# Patient Record
Sex: Male | Born: 1995 | Race: Black or African American | Hispanic: No | Marital: Married | Smoking: Never smoker
Health system: Southern US, Community
[De-identification: ages and names within clinical notes are randomized; demographics above are authoritative.]

## PROBLEM LIST (undated history)

## (undated) DIAGNOSIS — M6740 Ganglion, unspecified site: Secondary | ICD-10-CM

---
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
## 2016-04-23 ENCOUNTER — Emergency Department (HOSPITAL_COMMUNITY)
Admission: EM | Admit: 2016-04-23 | Discharge: 2016-04-23 | Disposition: A | Discharge status: Home / Self Care | Payer: 59 | Attending: Emergency Medicine | Admitting: Emergency Medicine

## 2016-04-23 ENCOUNTER — Encounter (HOSPITAL_COMMUNITY): Payer: Self-pay

## 2016-04-23 DIAGNOSIS — Z791 Long term (current) use of non-steroidal anti-inflammatories (NSAID): Secondary | ICD-10-CM | POA: Diagnosis not present

## 2016-04-23 DIAGNOSIS — R202 Paresthesia of skin: Secondary | ICD-10-CM | POA: Diagnosis present

## 2016-04-23 MED ORDER — PREDNISONE 20 MG PO TABS: 40.0000 mg | ORAL_TABLET | Freq: Every day | ORAL | 0 refills | Status: DC

## 2016-04-23 NOTE — Discharge Instructions (Signed)
Prednisone as prescribed until all gone. Follow up with a family doctor for further evaluation. Return if worsening symptoms or new concerning symptoms.

## 2016-04-23 NOTE — ED Notes (Signed)
PA at bedside.

## 2016-04-23 NOTE — ED Notes (Signed)
Bed: [facility] Expected date:  Expected time:  Means of arrival:  Comments: Pt in 22 

## 2016-04-23 NOTE — ED Provider Notes (Signed)
[facility]-EMERGENCY DEPT Provider Note   CSN: [id number] Arrival date & time: 04/23/16  1016     History   Chief Complaint Chief Complaint  Patient presents with  . Tingling    HPI Kevin Abbott is a 20 y.o. male.  HPI Kevin Abbott is a 20 y.o. male who presents to emergency department complaining of bilateral leg tingling. Patient states he has had similar symptoms in the past, states sometimes gets tingling his feet after he sits on the toilet for 2 long. States 2 days ago he woke up from his sleep with tingling in his feet. States since then symptoms on and off. He denies any weakness or numbness to his legs or feet. He states tingling in his all the way from his hips down to his toes. Denies any back pain or pain in his extremities. Denies any injuries or falls. No fever or chills. Denies any medical problems. No drugs or alcohol. No over-the-counter products.  Past Medical History:  Diagnosis Date  . Ganglion cyst     There are no active problems to display for this patient.   Past Surgical History:  Procedure Laterality Date  . ganglion cyst removal  2007       Home Medications    Prior to Admission medications   Medication Sig Start Date End Date Taking? Authorizing Provider  acetaminophen (TYLENOL) 500 MG tablet Take 500 mg by mouth every 6 (six) hours as needed. For headache    Historical Provider, MD  cyclobenzaprine (FLEXERIL) 10 MG tablet Take 1 tablet (10 mg total) by mouth 2 (two) times daily as needed for muscle spasms. 05/20/14   Roxy Horsemanobert Browning, PA-C  ibuprofen (ADVIL,MOTRIN) 600 MG tablet Take 1 tab PO Q6h x 2 days then Q6h prn 07/19/12   Lowanda FosterMindy Brewer, NP  ibuprofen (ADVIL,MOTRIN) 800 MG tablet Take 1 tablet (800 mg total) by mouth 3 (three) times daily. 05/20/14   Roxy Horsemanobert Browning, PA-C    Family History No family history on file.  Social History Social History  Substance Use Topics  . Smoking status: Never Smoker  . Smokeless tobacco: Never Used  .  Alcohol use No     Allergies   Amoxicillin and Latex   Review of Systems Review of Systems  Constitutional: Negative for chills and fever.  Respiratory: Negative for cough, chest tightness and shortness of breath.   Cardiovascular: Negative for chest pain, palpitations and leg swelling.  Gastrointestinal: Negative for abdominal distention, abdominal pain, diarrhea, nausea and vomiting.  Musculoskeletal: Negative for arthralgias, myalgias, neck pain and neck stiffness.  Skin: Negative for rash.  Allergic/Immunologic: Negative for immunocompromised state.  Neurological: Positive for numbness. Negative for dizziness, weakness, light-headedness and headaches.  All other systems reviewed and are negative.    Physical Exam Updated Vital Signs BP 117/74 (BP Location: Left Arm)   Pulse 72   Temp 98.7 F (37.1 C) (Oral)   Resp 14   Ht 6' (1.829 m)   SpO2 99%   Physical Exam  Constitutional: He appears well-developed and well-nourished. No distress.  HENT:  Head: Normocephalic and atraumatic.  Eyes: Conjunctivae are normal.  Neck: Neck supple.  Cardiovascular: Normal rate, regular rhythm and normal heart sounds.   Pulmonary/Chest: Effort normal. No respiratory distress. He has no wheezes. He has no rales.  Musculoskeletal: He exhibits no edema.  Full range of motion of bilateral legs at hip, knee, ankle joints. Dorsal pedal pulses intact. Legs are warm, cap refill less than 2 seconds  distally bilaterally.  Neurological: He is alert.  5 out of 5 and equal strength in bilateral lower extremities. Patellar reflexes and ankle reflexes are 2+ bilaterally. Sensation intact in upper and lower legs, in all dermatomes.  Skin: Skin is warm and dry. Capillary refill takes less than 2 seconds.  Nursing note and vitals reviewed.    ED Treatments / Results  Labs (all labs ordered are listed, but only abnormal results are displayed) Labs Reviewed - No data to display  EKG  EKG  Interpretation None       Radiology No results found.  Procedures Procedures (including critical care time)  Medications Ordered in ED Medications - No data to display   Initial Impression / Assessment and Plan / ED Course  I have reviewed the triage vital signs and the nursing notes.  Pertinent labs & imaging results that were available during my care of the patient were reviewed by me and considered in my medical decision making (see chart for details).  Clinical Course    Patient emergency department with tingling to bilateral legs, worse after sleeping and sitting for prolonged period of time. Suspect possible nerve inflammation. He has no weakness or decreased sensation on exam, reflexes are intact. The stomach do not think he needs any emergent imaging. I did discuss with him that if his symptoms progress or worsen he will need to follow-up for further evaluation. Considered nerve impingement, radicular symptoms, Guillain-Barr. Patient understands the plan and will follow-up or return if worsening. Will start on prednisone taper.  Vitals:   04/23/16 1021 04/23/16 1241  BP: 117/74 126/79  Pulse: 72 78  Resp: 14 16  Temp: 98.7 F (37.1 C)   TempSrc: Oral   SpO2: 99% 100%  Height: 6' (1.829 m)     Final Clinical Impressions(s) / ED Diagnoses   Final diagnoses:  Paresthesia    New Prescriptions Discharge Medication List as of 04/23/2016 12:05 PM    START taking these medications   Details  predniSONE (DELTASONE) 20 MG tablet Take 2 tablets (40 mg total) by mouth daily., Starting Tue 04/23/2016, Print         Jaynie Crumbleatyana Deara Bober, PA-C 04/23/16 2009    Alvira MondayErin Schlossman, MD 04/25/16 0201

## 2016-04-23 NOTE — ED Triage Notes (Signed)
Pt presents with c/o tingling in both legs that started when he woke up yesterday. Pt denies any injury, denies numbness to the area, reports only a tingling feeling. Pt ambulatory to triage.

## 2016-04-23 NOTE — ED Notes (Signed)
Discharge instructions, follow up care, and rx x1 reviewed with patient. Patient verbalized understanding. 

## 2016-05-06 ENCOUNTER — Emergency Department (HOSPITAL_COMMUNITY)
Admission: EM | Admit: 2016-05-06 | Discharge: 2016-05-07 | Disposition: A | Discharge status: Home / Self Care | Payer: 59 | Attending: Emergency Medicine | Admitting: Emergency Medicine

## 2016-05-06 ENCOUNTER — Encounter (HOSPITAL_COMMUNITY): Payer: Self-pay | Admitting: Emergency Medicine

## 2016-05-06 DIAGNOSIS — R45851 Suicidal ideations: Secondary | ICD-10-CM | POA: Insufficient documentation

## 2016-05-06 DIAGNOSIS — Z5181 Encounter for therapeutic drug level monitoring: Secondary | ICD-10-CM | POA: Insufficient documentation

## 2016-05-06 DIAGNOSIS — F4321 Adjustment disorder with depressed mood: Secondary | ICD-10-CM | POA: Diagnosis not present

## 2016-05-06 LAB — CBC
HEMATOCRIT: 43.1 % (ref 39.0–52.0)
HEMOGLOBIN: 14.6 g/dL (ref 13.0–17.0)
MCH: 28.3 pg (ref 26.0–34.0)
MCHC: 33.9 g/dL (ref 30.0–36.0)
MCV: 83.7 fL (ref 78.0–100.0)
Platelets: 191 10*3/uL (ref 150–400)
RBC: 5.15 MIL/uL (ref 4.22–5.81)
RDW: 13.4 % (ref 11.5–15.5)
WBC: 5.9 10*3/uL (ref 4.0–10.5)

## 2016-05-06 LAB — SALICYLATE LEVEL

## 2016-05-06 LAB — COMPREHENSIVE METABOLIC PANEL
ALK PHOS: 67 U/L (ref 38–126)
ALT: 15 U/L — AB (ref 17–63)
AST: 21 U/L (ref 15–41)
Albumin: 5 g/dL (ref 3.5–5.0)
Anion gap: 5 (ref 5–15)
BILIRUBIN TOTAL: 0.9 mg/dL (ref 0.3–1.2)
BUN: 15 mg/dL (ref 6–20)
CHLORIDE: 108 mmol/L (ref 101–111)
CO2: 27 mmol/L (ref 22–32)
CREATININE: 1.04 mg/dL (ref 0.61–1.24)
Calcium: 9.8 mg/dL (ref 8.9–10.3)
GFR calc Af Amer: >60 mL/min (ref >60)
GFR calc non Af Amer: >60 mL/min (ref >60)
GLUCOSE: 98 mg/dL (ref 65–99)
Potassium: 3.9 mmol/L (ref 3.5–5.1)
SODIUM: 140 mmol/L (ref 135–145)
Total Protein: 8.2 g/dL — ABNORMAL HIGH (ref 6.5–8.1)

## 2016-05-06 LAB — ETHANOL: Alcohol, Ethyl (B): <5 mg/dL (ref <5)

## 2016-05-06 LAB — RAPID URINE DRUG SCREEN, HOSP PERFORMED
AMPHETAMINES: NOT DETECTED
Barbiturates: NOT DETECTED
Benzodiazepines: NOT DETECTED
Cocaine: NOT DETECTED
OPIATES: NOT DETECTED
TETRAHYDROCANNABINOL: POSITIVE — AB

## 2016-05-06 LAB — ACETAMINOPHEN LEVEL: Acetaminophen (Tylenol), Serum: <10 ug/mL — ABNORMAL LOW (ref 10–30)

## 2016-05-06 MED ORDER — IBUPROFEN 200 MG PO TABS: 600.0000 mg | ORAL_TABLET | Freq: Three times a day (TID) | ORAL | Status: DC | PRN

## 2016-05-06 MED ORDER — ACETAMINOPHEN 325 MG PO TABS: 650.0000 mg | ORAL_TABLET | ORAL | Status: DC | PRN

## 2016-05-06 MED ORDER — LORAZEPAM 1 MG PO TABS: 1.0000 mg | ORAL_TABLET | Freq: Three times a day (TID) | ORAL | Status: DC | PRN

## 2016-05-06 MED ORDER — ONDANSETRON HCL 4 MG PO TABS: 4.0000 mg | ORAL_TABLET | Freq: Three times a day (TID) | ORAL | Status: DC | PRN

## 2016-05-06 NOTE — [facility] Assessment (Signed)
[facility] Assessment Progress Note   Case was staffed with Lord DNP who recommended patient be monitored and re-evaluated in the a.m.  
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
## 2016-05-06 NOTE — ED Triage Notes (Signed)
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 Pt has been unable to work because of hte pain medications he is on for his knee. Pt also sts his girlfriend made a comment to him at the mall earlier today that made him upset. Pt sts a family member and a friend died recently. Pt sts when he was driving earlier today he felt like half of him was wanting him to drive off the bridge. Pt sts he feels like he needs help so he came here. Pt tearful in triage.

## 2016-05-06 NOTE — ED Notes (Signed)
[id number] Expected date:  Expected time:  Means of arrival:  Comments: Hold for Triage 9

## 2016-05-06 NOTE — ED Notes (Signed)
Bed: WTR9 Expected date:  Expected time:  Means of arrival:  Comments: 
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
## 2016-05-06 NOTE — ED Notes (Signed)
Pt wanded a 2nd time by [employer].

## 2016-05-06 NOTE — ED Notes (Signed)
Bed: WTR8 Expected date:  Expected time:  Means of arrival:  Comments: 

## 2016-05-07 DIAGNOSIS — F4321 Adjustment disorder with depressed mood: Secondary | ICD-10-CM | POA: Diagnosis not present

## 2016-05-07 NOTE — BH Assessment (Signed)
[facility] Assessment Progress Note  Per Thedore MinsMojeed Akintayo, MD, this pt does not require psychiatric hospitalization at this time.  [facility] to follow up with an outpatient therapist.  This writer spoke to pt in person.  He does not want me to schedule an appointment for him, but is willing to accept referrals to follow up at his own initiative.  Discharge instructions include referral information for the [facility] [place], [facility], and [facility].  Pt's nurse, Diane, has been notified.  Doylene Canninghomas Odysseus Cada, MA Triage Specialist [phone]

## 2016-05-07 NOTE — ED Notes (Signed)
Pt expressed readiness to be discharged. Denies SI/HI. Pt discharged with resources for follow-up treatment. Pt verbalized understanding of discharge instructions. All belongings returned to pt who signed for same. Pt escorted to the exit and left and a friend or family member.

## 2016-05-07 NOTE — Discharge Instructions (Signed)
For your ongoing behavioral health needs, you are advised to follow up with an outpatient therapist.  Contact one of the following practices at your earliest opportunity to ask about scheduling an intake appointment:       [facility]      [address]      [place], [place] [address]      [phone]       [facility]      [facility]      [address]      [place] [address]      [phone]       [facility]      [address], [address]      [place], [place] [address]      [phone]
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
## 2016-05-08 NOTE — [facility] Suicide Risk Assessment (Signed)
Suicide Risk Assessment  Discharge Assessment   [facility]   Principal Problem: Adjustment disorder with depressed mood Discharge Diagnoses:  Patient Active Problem List   Diagnosis Date Noted  . Adjustment disorder with depressed mood [F43.21] 05/07/2016    Priority: High    Total Time spent with patient: 45 minutes   Musculoskeletal: Strength & Muscle Tone: within normal limits Gait & Station: normal Patient leans: N/A  Psychiatric Specialty Exam: Physical Exam  Constitutional: He is oriented to person, place, and time. He appears well-developed and well-nourished.  HENT:  Head: Normocephalic.  Neck: Normal range of motion.  Respiratory: Effort normal.  Musculoskeletal: Normal range of motion.  Neurological: He is alert and oriented to person, place, and time.  Skin: Skin is warm and dry.  Psychiatric: His speech is normal and behavior is normal. Judgment and thought content normal. Cognition and memory are normal. He exhibits a depressed mood.    Review of Systems  Constitutional: Negative.   HENT: Negative.   Eyes: Negative.   Respiratory: Negative.   Cardiovascular: Negative.   Gastrointestinal: Negative.   Genitourinary: Negative.   Musculoskeletal: Negative.   Skin: Negative.   Neurological: Negative.   Endo/Heme/Allergies: Negative.   Psychiatric/Behavioral: Positive for depression.    Blood pressure 112/92, pulse 105, temperature 98.3 F (36.8 C), temperature source Oral, resp. rate 18, SpO2 98 %.There is no height or weight on file to calculate BMI.  General Appearance: Casual  Eye Contact:  Good  Speech:  Normal Rate  Volume:  Normal  Mood:  Depressed, mild  Affect:  Non-Congruent  Thought Process:  Coherent and Descriptions of Associations: Intact  Orientation:  Full (Time, Place, and Person)  Thought Content:  WDL  Suicidal Thoughts:  No  Homicidal Thoughts:  No  Memory:  Immediate;   Good Recent;   Good Remote;    Good  Judgement:  Good  Insight:  Good  Psychomotor Activity:  Normal  Concentration:  Concentration: Good and Attention Span: Good  Recall:  Good  Fund of Knowledge:  Good  Language:  Fair  Akathisia:  No  Handed:  Right  AIMS (if indicated):     Assets:  Housing Intimacy Leisure Time Physical Health Resilience Social Support Vocational/Educational  ADL's:  Intact  Cognition:  WNL  Sleep:       Mental Status Per Nursing Assessment::   On Admission:   suicidal ideations  Demographic Factors:  Male and Adolescent or young adult  Loss Factors: NA  Historical Factors: NA  Risk Reduction Factors:   Responsible for children under 20 years of age, Sense of responsibility to family, Employed, Living with another person, especially a relative, Positive social support and Positive coping skills or problem solving skills  Continued Clinical Symptoms:  Depression, mild  Cognitive Features That Contribute To Risk:  None    Suicide Risk:  Minimal: No identifiable suicidal ideation.  Patients presenting with no risk factors but with morbid ruminations; may be classified as minimal risk based on the severity of the depressive symptoms    Plan Of Care/Follow-up recommendations:  Activity:  as tolerated Diet:  heart healthy diet  Lindbergh Winkles, NP 05/08/2016, 9:36 PM

## 2016-09-27 ENCOUNTER — Encounter (HOSPITAL_COMMUNITY): Payer: Self-pay | Admitting: Emergency Medicine

## 2016-09-27 ENCOUNTER — Emergency Department (HOSPITAL_COMMUNITY): Payer: Self-pay

## 2016-09-27 DIAGNOSIS — Z5321 Procedure and treatment not carried out due to patient leaving prior to being seen by health care provider: Secondary | ICD-10-CM | POA: Insufficient documentation

## 2016-09-27 DIAGNOSIS — S61211A Laceration without foreign body of left index finger without damage to nail, initial encounter: Secondary | ICD-10-CM | POA: Insufficient documentation

## 2016-09-27 DIAGNOSIS — Y92009 Unspecified place in unspecified non-institutional (private) residence as the place of occurrence of the external cause: Secondary | ICD-10-CM | POA: Insufficient documentation

## 2016-09-27 DIAGNOSIS — W268XXA Contact with other sharp object(s), not elsewhere classified, initial encounter: Secondary | ICD-10-CM | POA: Insufficient documentation

## 2016-09-27 DIAGNOSIS — Y999 Unspecified external cause status: Secondary | ICD-10-CM | POA: Insufficient documentation

## 2016-09-27 DIAGNOSIS — Y9389 Activity, other specified: Secondary | ICD-10-CM | POA: Insufficient documentation

## 2016-09-27 NOTE — ED Triage Notes (Signed)
Pt states he got cut while watching dishes at home with a glass of wine, on his index finger, around 2 inches long laceration present on his finger left hand, bleeding control, no changes on sensation able to move all fingers 3/10 pain a this time.

## 2016-09-28 ENCOUNTER — Emergency Department (HOSPITAL_COMMUNITY)
Admission: EM | Admit: 2016-09-28 | Discharge: 2016-09-28 | Disposition: A | Discharge status: Home / Self Care | Payer: Self-pay | Attending: Emergency Medicine | Admitting: Emergency Medicine

## 2016-09-28 NOTE — ED Notes (Signed)
Patient up to desk with mother.  Patient and mother arguing about patient staying.  This RN explained wait times, and explained that there might be rooms opening soon.   Bosie ClosJudith, EMT [facility] and expected wait time same.  Patient decided to leave.  Return precautions reviewed, and risks associated with leaving without assessment/sutures reviewed.  Patient aware, [facility] "[facility] wait time [facility] only thirty minutes"

## 2018-06-17 ENCOUNTER — Encounter (HOSPITAL_COMMUNITY): Payer: Self-pay | Admitting: Emergency Medicine

## 2018-06-17 ENCOUNTER — Ambulatory Visit (HOSPITAL_COMMUNITY)
Admission: EM | Admit: 2018-06-17 | Discharge: 2018-06-17 | Disposition: A | Discharge status: Home / Self Care | Payer: Self-pay | Attending: Nurse Practitioner | Admitting: Nurse Practitioner

## 2018-06-17 ENCOUNTER — Other Ambulatory Visit: Payer: Self-pay

## 2018-06-17 DIAGNOSIS — S239XXA Sprain of unspecified parts of thorax, initial encounter: Secondary | ICD-10-CM

## 2018-06-17 DIAGNOSIS — S161XXA Strain of muscle, fascia and tendon at neck level, initial encounter: Secondary | ICD-10-CM

## 2018-06-17 MED ORDER — CYCLOBENZAPRINE HCL 10 MG PO TABS: 10.0000 mg | ORAL_TABLET | Freq: Two times a day (BID) | ORAL | 0 refills | Status: AC | PRN

## 2018-06-17 MED ORDER — IBUPROFEN 800 MG PO TABS: 800.0000 mg | ORAL_TABLET | Freq: Three times a day (TID) | ORAL | 0 refills | Status: AC

## 2018-06-17 NOTE — Discharge Instructions (Signed)
Take medications as prescribed. Alternate between ice and heat to affected areas for at 15 minutes at least four times daily. No heavy lifting or strenuous activity for the next 5 days
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
## 2019-06-22 ENCOUNTER — Other Ambulatory Visit: Payer: Self-pay

## 2019-06-22 ENCOUNTER — Ambulatory Visit (HOSPITAL_COMMUNITY)
Admission: EM | Admit: 2019-06-22 | Discharge: 2019-06-22 | Disposition: A | Discharge status: Home / Self Care | Payer: Self-pay | Attending: Family Medicine | Admitting: Family Medicine

## 2019-06-22 ENCOUNTER — Encounter (HOSPITAL_COMMUNITY): Payer: Self-pay

## 2019-06-22 DIAGNOSIS — W5911XA Bitten by nonvenomous snake, initial encounter: Secondary | ICD-10-CM

## 2019-06-22 DIAGNOSIS — M25572 Pain in left ankle and joints of left foot: Secondary | ICD-10-CM

## 2019-06-22 NOTE — ED Provider Notes (Signed)
[name]    CSN: [id number] Arrival date & time: 06/22/19  1826      History   Chief Complaint Chief Complaint  Patient presents with  . Snake Bite    Foot    HPI Kevin Abbott is a 23 y.o. male.   He is presenting with a snake bite to the left ankle.  This occurred about an hour ago.  Denies any significant pain.  Does have some tenderness at the 2 puncture wounds over the left ankle.  Denies any streaking or trouble walking.  The snake did not appear to be venomous upon his pictures with his cell phone.  Has been bitten before when he was younger and this feels much different than that.  Denies any numbness or tingling.  HPI  Past Medical History:  Diagnosis Date  . Ganglion cyst     Patient Active Problem List   Diagnosis Date Noted  . Adjustment disorder with depressed mood 05/07/2016    Past Surgical History:  Procedure Laterality Date  . ganglion cyst removal  2007       Home Medications    Prior to Admission medications   Medication Sig Start Date End Date Taking? Authorizing Provider  cyclobenzaprine (FLEXERIL) 10 MG tablet Take 1 tablet (10 mg total) by mouth 2 (two) times daily as needed for muscle spasms. 06/17/18   Kevin Sack, FNP  ibuprofen (ADVIL,MOTRIN) 800 MG tablet Take 1 tablet (800 mg total) by mouth 3 (three) times daily. 06/17/18   Kevin Sack, FNP    Family History Family History  Problem Relation Age of Onset  . Lupus Mother   . Sickle cell anemia Mother   . Healthy Father     Social History Social History   Tobacco Use  . Smoking status: Never Smoker  . Smokeless tobacco: Never Used  Substance Use Topics  . Alcohol use: Yes    Comment: socially  . Drug use: No     Allergies   Amoxicillin, Prednisone, and Latex   Review of Systems Review of Systems  Constitutional: Negative for fever.  HENT: Negative for congestion.   Respiratory: Negative for cough.   Cardiovascular: Negative for chest pain.   Gastrointestinal: Negative for abdominal pain.  Musculoskeletal: Negative for gait problem.  Skin: Positive for wound.  Neurological: Negative for weakness.  Hematological: Negative for adenopathy.     Physical Exam Triage Vital Signs ED Triage Vitals  Enc Vitals Group     BP 06/22/19 1841 122/82     Pulse Rate 06/22/19 1841 87     Resp 06/22/19 1841 16     Temp 06/22/19 1841 98.6 F (37 C)     Temp Source 06/22/19 1841 Temporal     SpO2 06/22/19 1841 100 %     Weight --      Height --      Head Circumference --      Peak Flow --      Pain Score 06/22/19 1839 0     Pain Loc --      Pain Edu? --      Excl. in [place]? --    No data found.  Updated Vital Signs BP 122/82 (BP Location: Right Arm)   Pulse 87   Temp 98.6 F (37 C) (Temporal)   Resp 16   SpO2 100%   Visual Acuity Right Eye Distance:   Left Eye Distance:   Bilateral Distance:    Right Eye Near:  Left Eye Near:    Bilateral Near:     Physical Exam Gen: NAD, alert, cooperative with exam, well-appearing ENT: normal lips, normal nasal mucosa,  Eye: normal EOM, normal conjunctiva and lids CV:  no edema, +2 pedal pulses   Resp: no accessory muscle use, non-labored,   Skin: no rashes, no areas of induration  Neuro: normal tone, normal sensation to touch Psych:  normal insight, alert and oriented MSK:  Left ankle: 2 small puncture wounds just posteriorly to the lateral malleolus. Mild tenderness to palpation of these. No streaking or overlying redness. Neurovascular intact      UC Treatments / Results  Labs (all labs ordered are listed, but only abnormal results are displayed) Labs Reviewed - No data to display  EKG   Radiology No results found.  Procedures Procedures (including critical care time)  Medications Ordered in UC Medications - No data to display  Initial Impression / Assessment and Plan / UC Course  I have reviewed the triage vital signs and the nursing notes.   Pertinent labs & imaging results that were available during my care of the patient were reviewed by me and considered in my medical decision making (see chart for details).     Kevin Abbott is a 23 year old male that is presenting with snakebite and left ankle pain.  Appears to be nonvenomous based on the presentation.  He had this occur about an hour ago.  Counseled on wound management and care.  He reports having tetanus within the last 10 years.  Counseled to seek immediate care if there is any change to the site of puncture.  Counseled on supportive care.  Final Clinical Impressions(s) / UC Diagnoses   Final diagnoses:  Snake bite, initial encounter  Acute left ankle pain     Discharge Instructions     Please keep the area clean  Please monitor the area.  Please be seen in the emergency department if your symptoms change at all      ED Prescriptions    None     PDMP not reviewed this encounter.   Myra Rude, MD 06/22/19 [phone]

## 2019-06-22 NOTE — Discharge Instructions (Signed)
Please keep the area clean  Please monitor the area.  Please be seen in the emergency department if your symptoms change at all

## 2019-06-22 NOTE — ED Triage Notes (Signed)
Patient presents to Urgent Care with complaints of snake bite on his left foot near his ankle since earlier this afternoon. Patient reports he was cutting grass when he felt something on his ankle and then it looked like a baby.  Bite site shows two small puncture sites with minimal redness, no swelling noted. Pt has picture of the snake that bit him.

## 2022-06-12 ENCOUNTER — Encounter (HOSPITAL_BASED_OUTPATIENT_CLINIC_OR_DEPARTMENT_OTHER): Payer: Self-pay

## 2022-06-12 ENCOUNTER — Other Ambulatory Visit: Payer: Self-pay

## 2022-06-12 ENCOUNTER — Emergency Department (HOSPITAL_BASED_OUTPATIENT_CLINIC_OR_DEPARTMENT_OTHER)
Admission: EM | Admit: 2022-06-12 | Discharge: 2022-06-12 | Disposition: A | Discharge status: Home / Self Care | Payer: Self-pay | Attending: Emergency Medicine | Admitting: Emergency Medicine

## 2022-06-12 DIAGNOSIS — Z5321 Procedure and treatment not carried out due to patient leaving prior to being seen by health care provider: Secondary | ICD-10-CM | POA: Insufficient documentation

## 2022-06-12 DIAGNOSIS — M25511 Pain in right shoulder: Secondary | ICD-10-CM | POA: Insufficient documentation

## 2022-06-12 DIAGNOSIS — Z0279 Encounter for issue of other medical certificate: Secondary | ICD-10-CM | POA: Insufficient documentation

## 2022-06-12 NOTE — ED Notes (Signed)
Returned identification labels to registration staff and notified he was leaving. No distress reported. VSS. Alert, oriented, and ambulatory.

## 2022-06-12 NOTE — ED Triage Notes (Signed)
Pt states that he needs a note for work, pt reports having R shoulder pain from cutting grass.
